# Patient Record
Sex: Female | Born: 1995 | Hispanic: No | Marital: Single | Smoking: Never smoker
Health system: Southern US, Community
[De-identification: ages and names within clinical notes are randomized; demographics above are authoritative.]

## PROBLEM LIST (undated history)

## (undated) DIAGNOSIS — N912 Amenorrhea, unspecified: Secondary | ICD-10-CM

## (undated) DIAGNOSIS — J45909 Unspecified asthma, uncomplicated: Secondary | ICD-10-CM

---
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
## 2014-04-14 ENCOUNTER — Other Ambulatory Visit: Payer: Self-pay | Admitting: Obstetrics & Gynecology

## 2014-04-14 DIAGNOSIS — Q51818 Other congenital malformations of uterus: Secondary | ICD-10-CM

## 2014-04-14 DIAGNOSIS — N912 Amenorrhea, unspecified: Secondary | ICD-10-CM

## 2014-04-20 ENCOUNTER — Ambulatory Visit
Admission: RE | Admit: 2014-04-20 | Discharge: 2014-04-20 | Disposition: A | Discharge status: Home / Self Care | Payer: BC Managed Care – PPO | Source: Ambulatory Visit | Attending: Obstetrics & Gynecology | Admitting: Obstetrics & Gynecology

## 2014-04-20 DIAGNOSIS — Q51818 Other congenital malformations of uterus: Secondary | ICD-10-CM

## 2014-04-20 DIAGNOSIS — N912 Amenorrhea, unspecified: Secondary | ICD-10-CM

## 2014-04-20 MED ORDER — GADOBENATE DIMEGLUMINE 529 MG/ML IV SOLN
18.0000 mL | Freq: Once | INTRAVENOUS | Status: AC | PRN
  Administered 2014-04-20: 18 mL via INTRAVENOUS

## 2015-06-01 ENCOUNTER — Emergency Department (HOSPITAL_COMMUNITY)
Admission: EM | Admit: 2015-06-01 | Discharge: 2015-06-01 | Disposition: A | Discharge status: Home / Self Care | Payer: BLUE CROSS/BLUE SHIELD | Attending: Emergency Medicine | Admitting: Emergency Medicine

## 2015-06-01 ENCOUNTER — Encounter (HOSPITAL_COMMUNITY): Payer: Self-pay | Admitting: Emergency Medicine

## 2015-06-01 ENCOUNTER — Emergency Department (HOSPITAL_COMMUNITY): Payer: BLUE CROSS/BLUE SHIELD

## 2015-06-01 DIAGNOSIS — Z3202 Encounter for pregnancy test, result negative: Secondary | ICD-10-CM | POA: Insufficient documentation

## 2015-06-01 DIAGNOSIS — R079 Chest pain, unspecified: Secondary | ICD-10-CM | POA: Diagnosis not present

## 2015-06-01 DIAGNOSIS — R110 Nausea: Secondary | ICD-10-CM | POA: Insufficient documentation

## 2015-06-01 DIAGNOSIS — J45909 Unspecified asthma, uncomplicated: Secondary | ICD-10-CM | POA: Insufficient documentation

## 2015-06-01 DIAGNOSIS — R1011 Right upper quadrant pain: Secondary | ICD-10-CM | POA: Diagnosis present

## 2015-06-01 HISTORY — DX: Amenorrhea, unspecified: N91.2

## 2015-06-01 HISTORY — DX: Unspecified asthma, uncomplicated: J45.909

## 2015-06-01 LAB — URINALYSIS, ROUTINE W REFLEX MICROSCOPIC
BILIRUBIN URINE: NEGATIVE
Glucose, UA: NEGATIVE mg/dL
Hgb urine dipstick: NEGATIVE
Ketones, ur: NEGATIVE mg/dL
LEUKOCYTES UA: NEGATIVE
NITRITE: NEGATIVE
Protein, ur: NEGATIVE mg/dL
Specific Gravity, Urine: 1.023 (ref 1.005–1.030)
UROBILINOGEN UA: 1 mg/dL (ref 0.0–1.0)
pH: 8.5 — ABNORMAL HIGH (ref 5.0–8.0)

## 2015-06-01 LAB — COMPREHENSIVE METABOLIC PANEL
ALK PHOS: 74 U/L (ref 38–126)
ALT: 29 U/L (ref 14–54)
AST: 25 U/L (ref 15–41)
Albumin: 4.2 g/dL (ref 3.5–5.0)
Anion gap: 8 (ref 5–15)
BILIRUBIN TOTAL: 0.5 mg/dL (ref 0.3–1.2)
BUN: 11 mg/dL (ref 6–20)
CO2: 26 mmol/L (ref 22–32)
Calcium: 9.3 mg/dL (ref 8.9–10.3)
Chloride: 105 mmol/L (ref 101–111)
Creatinine, Ser: 0.69 mg/dL (ref 0.44–1.00)
GFR calc Af Amer: >60 mL/min (ref >60)
GLUCOSE: 100 mg/dL — AB (ref 65–99)
Potassium: 3.6 mmol/L (ref 3.5–5.1)
SODIUM: 139 mmol/L (ref 135–145)
TOTAL PROTEIN: 7.2 g/dL (ref 6.5–8.1)

## 2015-06-01 LAB — CBC
HCT: 43 % (ref 36.0–46.0)
HEMOGLOBIN: 14.6 g/dL (ref 12.0–15.0)
MCH: 30.5 pg (ref 26.0–34.0)
MCHC: 34 g/dL (ref 30.0–36.0)
MCV: 89.8 fL (ref 78.0–100.0)
Platelets: 293 10*3/uL (ref 150–400)
RBC: 4.79 MIL/uL (ref 3.87–5.11)
RDW: 12.6 % (ref 11.5–15.5)
WBC: 11.5 10*3/uL — ABNORMAL HIGH (ref 4.0–10.5)

## 2015-06-01 LAB — LIPASE, BLOOD: Lipase: 37 U/L (ref 22–51)

## 2015-06-01 LAB — POC URINE PREG, ED: Preg Test, Ur: NEGATIVE

## 2015-06-01 MED ORDER — IBUPROFEN 800 MG PO TABS: 800.0000 mg | ORAL_TABLET | Freq: Three times a day (TID) | ORAL | Status: AC

## 2015-06-01 MED ORDER — IBUPROFEN 800 MG PO TABS
800.0000 mg | ORAL_TABLET | Freq: Once | ORAL | Status: AC
  Administered 2015-06-01: 800 mg via ORAL
  Filled 2015-06-01: qty 1

## 2015-06-01 NOTE — ED Notes (Signed)
Pt. reports RUQ pain onset last night with nausea , denies emesis or diarrhea . No fever or chills.

## 2015-06-01 NOTE — Discharge Instructions (Signed)

## 2015-06-01 NOTE — ED Provider Notes (Signed)
CSN: [id number]     Arrival date & time 06/01/15  0009 History  This chart was scribed for Eber Hong, MD by Doreatha Martin, ED Scribe. This patient was seen in room A07C/A07C and the patient's care was started at 2:34 AM.     Chief Complaint  Patient presents with  . Abdominal Pain   The history is provided by the patient. No language interpreter was used.    HPI Comments: Kelli Terry is a 19 y.o. female who presents to the Emergency Department complaining of moderate, sharp RUQ abdominal pain onset last night and worsened today. She states associated chills, nausea. Pt notes that pain is worsened by deep breathing, positional changes and relieved in the supine position. She states no new foods, no oral contraceptive use. Pt notes recent travel by plane to [place] one month ago. She denies vomiting, diarrhea, constipation, dysuria, cough, bloody stools, SOB, decreased appetite, current nausea.   Past Medical History  Diagnosis Date  . Asthma   . Amenorrhea    History reviewed. No pertinent past surgical history. No family history on file. History  Substance Use Topics  . Smoking status: Never Smoker   . Smokeless tobacco: Not on file  . Alcohol Use: No   OB History    No data available     Review of Systems  Constitutional: Positive for chills. Negative for appetite change.  Respiratory: Negative for cough and shortness of breath.   Gastrointestinal: Positive for nausea and abdominal pain. Negative for vomiting, diarrhea, constipation and blood in stool.  Genitourinary: Negative for dysuria.  All other systems reviewed and are negative.  Allergies  Review of patient's allergies indicates no known allergies.  Home Medications   Prior to Admission medications   Medication Sig Start Date End Date Taking? Authorizing Provider  ibuprofen (ADVIL,MOTRIN) 800 MG tablet Take 1 tablet (800 mg total) by mouth 3 (three) times daily. 06/01/15   Eber Hong, MD   BP 129/81 mmHg  Pulse 98   Temp(Src) 98.7 F (37.1 C) (Oral)  Resp 22  Ht 5\' 3"  (1.6 m)  Wt 196 lb (88.905 kg)  BMI 34.73 kg/m2  SpO2 99% Physical Exam  Constitutional: She appears well-developed and well-nourished. No distress.  HENT:  Head: Normocephalic and atraumatic.  Mouth/Throat: Oropharynx is clear and moist. No oropharyngeal exudate.  Eyes: Conjunctivae and EOM are normal. Pupils are equal, round, and reactive to light. Right eye exhibits no discharge. Left eye exhibits no discharge. No scleral icterus.  Neck: Normal range of motion. Neck supple. No JVD present. No thyromegaly present.  Cardiovascular: Normal rate, regular rhythm, normal heart sounds and intact distal pulses.  Exam reveals no gallop and no friction rub.   No murmur heard. Pulmonary/Chest: Effort normal and breath sounds normal. No respiratory distress. She has no wheezes. She has no rales.  Abdominal: Soft. Bowel sounds are normal. She exhibits no distension and no mass. There is tenderness.  TTP right anterior lateral lower ribs. No reproducible TTP to the RUQ.   Musculoskeletal: Normal range of motion. She exhibits no edema or tenderness.  Lymphadenopathy:    She has no cervical adenopathy.  Neurological: She is alert. Coordination normal.  Skin: Skin is warm and dry. No rash noted. No erythema.  Psychiatric: She has a normal mood and affect. Her behavior is normal.  Nursing note and vitals reviewed.  ED Course  Procedures (including critical care time) DIAGNOSTIC STUDIES: Oxygen Saturation is 99% on RA, normal by my interpretation.  COORDINATION OF CARE: 2:39 AM Discussed treatment plan with pt at bedside and pt agreed to plan.   Labs Review Labs Reviewed  COMPREHENSIVE METABOLIC PANEL - Abnormal; Notable for the following:    Glucose, Bld 100 (*)    All other components within normal limits  CBC - Abnormal; Notable for the following:    WBC 11.5 (*)    All other components within normal limits  URINALYSIS, ROUTINE W  REFLEX MICROSCOPIC (NOT AT [facility]) - Abnormal; Notable for the following:    APPearance CLOUDY (*)    pH 8.5 (*)    All other components within normal limits  LIPASE, BLOOD  POC URINE PREG, ED    Imaging Review Dg Chest 2 View  06/01/2015   CLINICAL DATA:  Lower chest pain for 1 day, shortness of breath. History of asthma.  EXAM: CHEST  2 VIEW  COMPARISON:  None.  FINDINGS: Cardiomediastinal silhouette is unremarkable. The lungs are clear without pleural effusions or focal consolidations. Trachea projects midline and there is no pneumothorax. Soft tissue planes and included osseous structures are non-suspicious. Mild lower thoracic levoscoliosis.  IMPRESSION: No acute cardiopulmonary process.   Electronically Signed   By: Awilda Metro M.D.   On: 06/01/2015 03:07      MDM   Final diagnoses:  Chest pain    The patient's abdominal discomfort is nonspecific, seems to be reproducible over the chest wall but no right upper quadrant tenderness. Labs are totally unremarkable. Chest x-ray shows no acute processes, I have personally viewed and interpreted these results and I agree with the radiologist interpretation. She has been given ibuprofen, she appears stable for discharge.  Meds given in ED:  Medications  ibuprofen (ADVIL,MOTRIN) tablet 800 mg (not administered)    New Prescriptions   IBUPROFEN (ADVIL,MOTRIN) 800 MG TABLET    Take 1 tablet (800 mg total) by mouth 3 (three) times daily.    I have personally viewed and interpreted the imaging and agree with radiologist interpretation.  I personally performed the services described in this documentation, which was scribed in my presence. The recorded information has been reviewed and is accurate.       Eber Hong, MD 06/01/15 [phone]

## 2016-12-16 IMAGING — CR DG CHEST 2V
2 series · 2 of 2 positions shown · non-contrast
Comparison: None.

CLINICAL DATA: Lower chest pain for 1 day, shortness of breath.
History of asthma.

EXAM:
CHEST  2 VIEW

[chest pa]
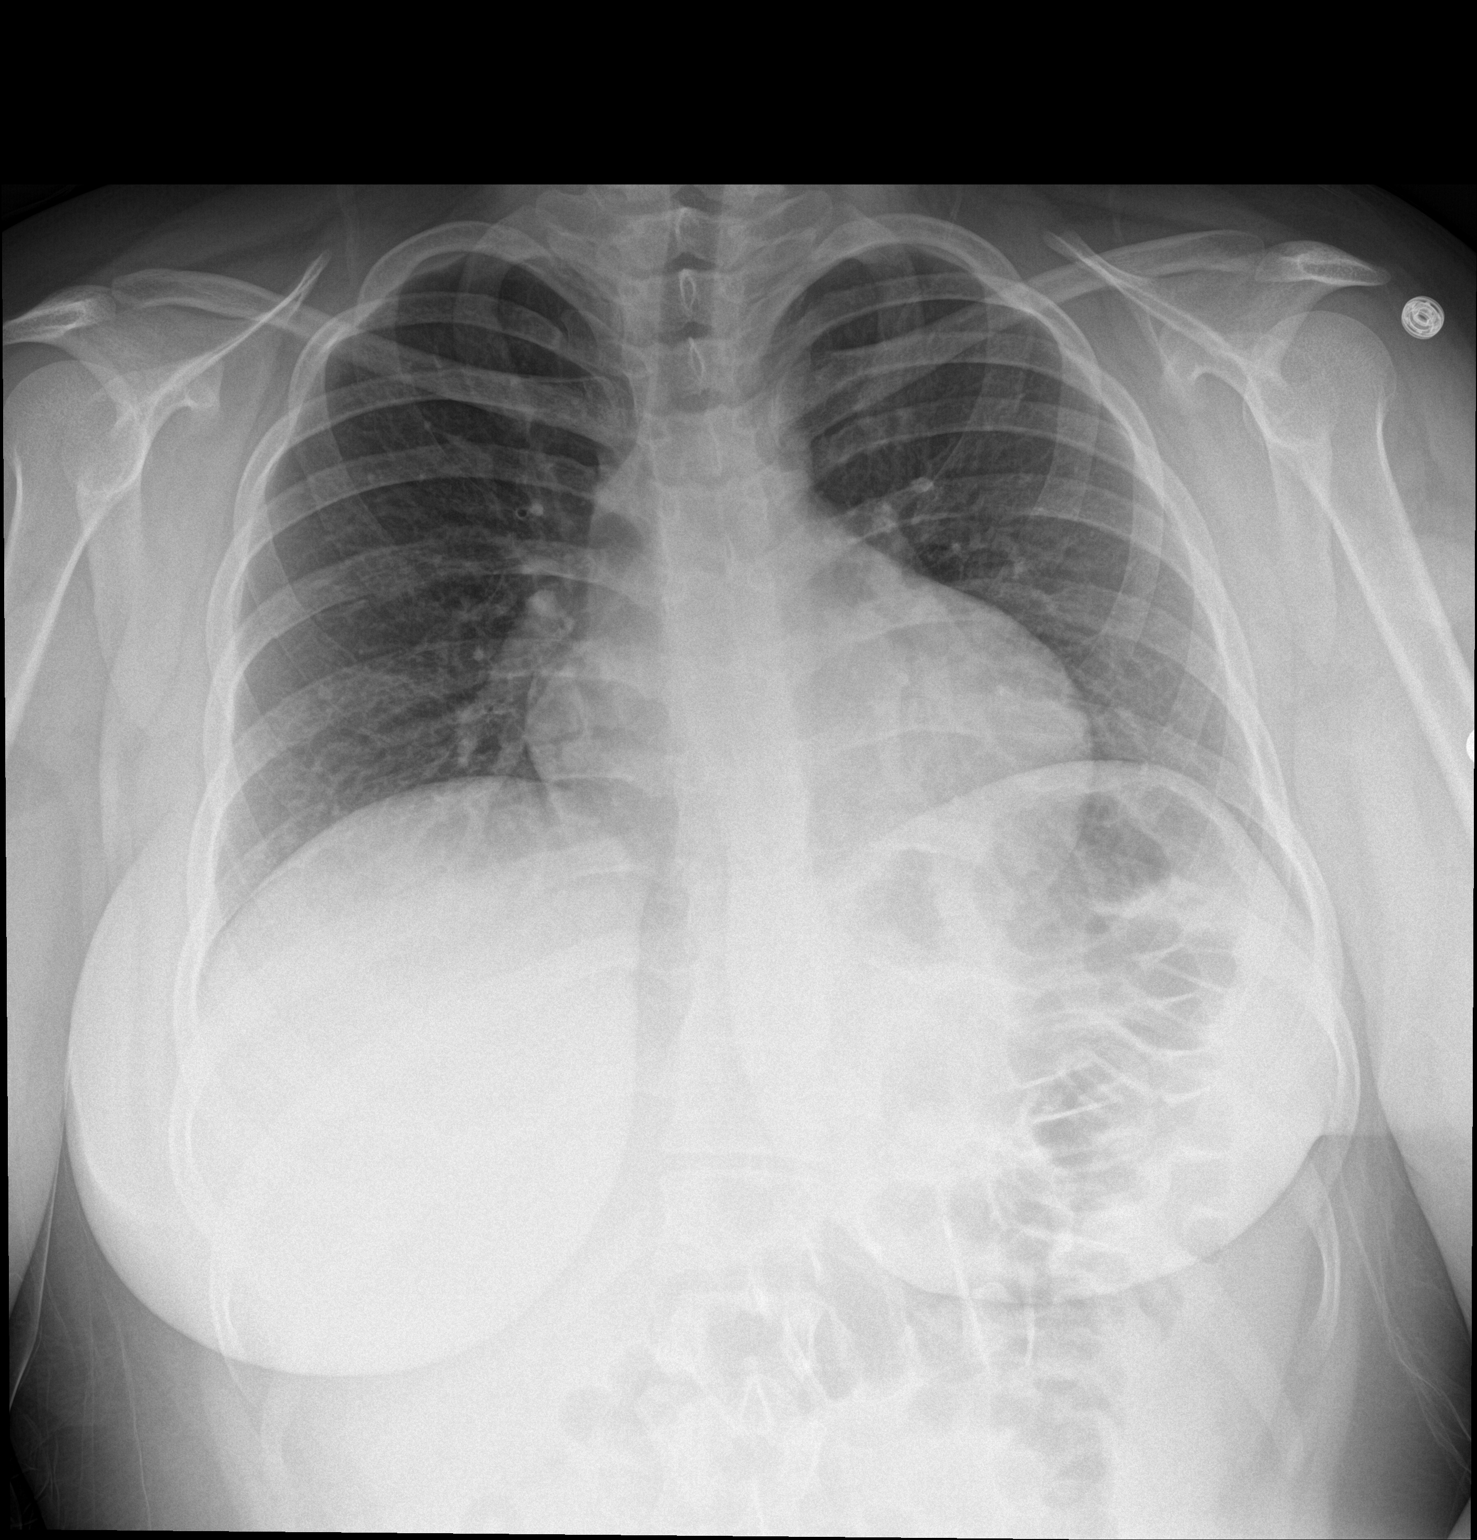

[chest lat]
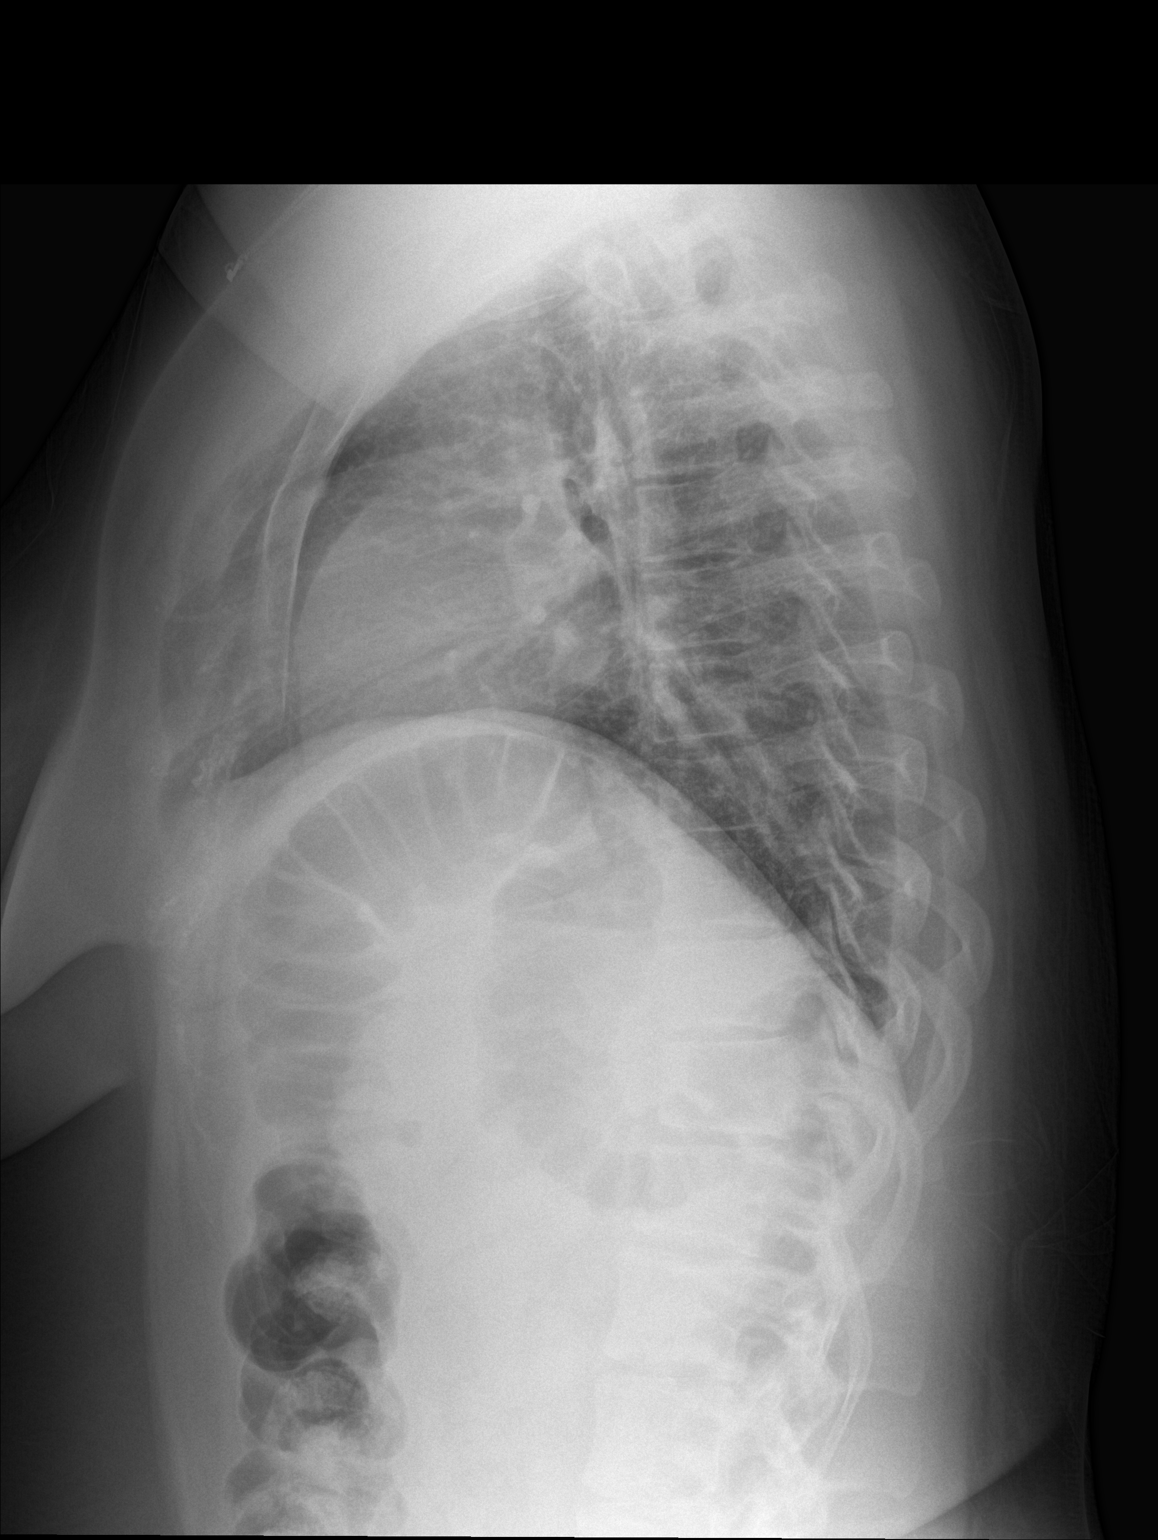

[2 of 2 positions shown; findings below may reference images not displayed]

FINDINGS: Cardiomediastinal silhouette is unremarkable. The lungs are clear
without pleural effusions or focal consolidations. Trachea projects
midline and there is no pneumothorax. Soft tissue planes and
included osseous structures are non-suspicious. Mild lower thoracic
levoscoliosis.
IMPRESSION: No acute cardiopulmonary process.

## 2023-12-11 DIAGNOSIS — Q528 Other specified congenital malformations of female genitalia: Secondary | ICD-10-CM | POA: Diagnosis not present

## 2023-12-11 DIAGNOSIS — Q7649 Other congenital malformations of spine, not associated with scoliosis: Secondary | ICD-10-CM | POA: Diagnosis not present

## 2024-06-13 ENCOUNTER — Emergency Department (HOSPITAL_BASED_OUTPATIENT_CLINIC_OR_DEPARTMENT_OTHER)

## 2024-06-13 ENCOUNTER — Emergency Department (HOSPITAL_BASED_OUTPATIENT_CLINIC_OR_DEPARTMENT_OTHER)
Admission: EM | Admit: 2024-06-13 | Discharge: 2024-06-13 | Disposition: A | Discharge status: Home / Self Care | Attending: Emergency Medicine | Admitting: Emergency Medicine

## 2024-06-13 ENCOUNTER — Other Ambulatory Visit: Payer: Self-pay

## 2024-06-13 ENCOUNTER — Encounter (HOSPITAL_BASED_OUTPATIENT_CLINIC_OR_DEPARTMENT_OTHER): Payer: Self-pay | Admitting: Emergency Medicine

## 2024-06-13 DIAGNOSIS — Q600 Renal agenesis, unilateral: Secondary | ICD-10-CM | POA: Diagnosis not present

## 2024-06-13 DIAGNOSIS — N201 Calculus of ureter: Secondary | ICD-10-CM

## 2024-06-13 DIAGNOSIS — R1031 Right lower quadrant pain: Secondary | ICD-10-CM | POA: Diagnosis present

## 2024-06-13 DIAGNOSIS — N134 Hydroureter: Secondary | ICD-10-CM | POA: Diagnosis not present

## 2024-06-13 LAB — COMPREHENSIVE METABOLIC PANEL WITH GFR
ALT: 11 U/L (ref 0–44)
AST: 20 U/L (ref 15–41)
Albumin: 4.3 g/dL (ref 3.5–5.0)
Alkaline Phosphatase: 61 U/L (ref 38–126)
Anion gap: 11 (ref 5–15)
BUN: 18 mg/dL (ref 6–20)
CO2: 22 mmol/L (ref 22–32)
Calcium: 9.7 mg/dL (ref 8.9–10.3)
Chloride: 105 mmol/L (ref 98–111)
Creatinine, Ser: 0.68 mg/dL (ref 0.44–1.00)
GFR, Estimated: >60 mL/min (ref >60)
Glucose, Bld: 90 mg/dL (ref 70–99)
Potassium: 4.6 mmol/L (ref 3.5–5.1)
Sodium: 138 mmol/L (ref 135–145)
Total Bilirubin: 0.5 mg/dL (ref 0.0–1.2)
Total Protein: 6.7 g/dL (ref 6.5–8.1)

## 2024-06-13 LAB — URINALYSIS, ROUTINE W REFLEX MICROSCOPIC
Bacteria, UA: NONE SEEN
Bilirubin Urine: NEGATIVE
Glucose, UA: NEGATIVE mg/dL
Ketones, ur: NEGATIVE mg/dL
Leukocytes,Ua: NEGATIVE
Nitrite: NEGATIVE
Protein, ur: NEGATIVE mg/dL
RBC / HPF: >50 RBC/hpf (ref 0–5)
Specific Gravity, Urine: 1.021 (ref 1.005–1.030)
pH: 6.5 (ref 5.0–8.0)

## 2024-06-13 LAB — CBC WITH DIFFERENTIAL/PLATELET
Abs Immature Granulocytes: 0.04 K/uL (ref 0.00–0.07)
Basophils Absolute: 0 K/uL (ref 0.0–0.1)
Basophils Relative: 0 %
Eosinophils Absolute: 0 K/uL (ref 0.0–0.5)
Eosinophils Relative: 0 %
HCT: 42.5 % (ref 36.0–46.0)
Hemoglobin: 14.6 g/dL (ref 12.0–15.0)
Immature Granulocytes: 0 %
Lymphocytes Relative: 26 %
Lymphs Abs: 2.5 K/uL (ref 0.7–4.0)
MCH: 31.5 pg (ref 26.0–34.0)
MCHC: 34.4 g/dL (ref 30.0–36.0)
MCV: 91.8 fL (ref 80.0–100.0)
Monocytes Absolute: 0.5 K/uL (ref 0.1–1.0)
Monocytes Relative: 5 %
Neutro Abs: 6.5 K/uL (ref 1.7–7.7)
Neutrophils Relative %: 69 %
Platelets: 261 K/uL (ref 150–400)
RBC: 4.63 MIL/uL (ref 3.87–5.11)
RDW: 12.8 % (ref 11.5–15.5)
WBC: 9.6 K/uL (ref 4.0–10.5)
nRBC: 0 % (ref 0.0–0.2)

## 2024-06-13 LAB — PREGNANCY, URINE: Preg Test, Ur: NEGATIVE

## 2024-06-13 LAB — LIPASE, BLOOD: Lipase: 66 U/L — ABNORMAL HIGH (ref 11–51)

## 2024-06-13 MED ORDER — OXYCODONE HCL 5 MG PO TABS: 5.0000 mg | ORAL_TABLET | Freq: Four times a day (QID) | ORAL | 0 refills | Status: AC | PRN

## 2024-06-13 MED ORDER — ONDANSETRON 4 MG PO TBDP: 4.0000 mg | ORAL_TABLET | Freq: Three times a day (TID) | ORAL | 0 refills | Status: AC | PRN

## 2024-06-13 NOTE — ED Provider Notes (Signed)
 [place] EMERGENCY DEPARTMENT AT [facility] Provider Note   CSN: [id number] Arrival date & time: 06/13/24  9157     Patient presents with: Abdominal Pain   Kelli Terry is a 28 y.o. female.   Patient with no past surgical history, history of solitary kidney -- presents to the emergency department today for evaluation of right lower quadrant abdominal pain.  Symptoms started yesterday, gradually worsened.  While she was at work last night, pain was more intense, caused an episode of vomiting.  She has just had nausea today.  She has had diarrhea without blood.  No constipation.  She noted blood in the urine last week for a few days, resolved.  She reports history of abdominal pains related to a traumatic event.  No fevers, chest pain, shortness of breath.  No treatments prior to arrival.       Prior to Admission medications   Medication Sig Start Date End Date Taking? Authorizing Provider  ibuprofen  (ADVIL ,MOTRIN ) 800 MG tablet Take 1 tablet (800 mg total) by mouth 3 (three) times daily. 06/01/15   Cleotilde Rogue, MD    Allergies: Patient has no known allergies.    Review of Systems  Updated Vital Signs BP 120/74 (BP Location: Right Arm)   Pulse 72   Temp 98.1 F (36.7 C) (Oral)   Resp 14   SpO2 100%   Physical Exam Vitals and nursing note reviewed.  Constitutional:      General: She is not in acute distress.    Appearance: She is well-developed.  HENT:     Head: Normocephalic and atraumatic.     Right Ear: External ear normal.     Left Ear: External ear normal.     Nose: Nose normal.  Eyes:     Conjunctiva/sclera: Conjunctivae normal.  Cardiovascular:     Rate and Rhythm: Normal rate and regular rhythm.     Heart sounds: No murmur heard. Pulmonary:     Effort: No respiratory distress.     Breath sounds: No wheezing, rhonchi or rales.  Abdominal:     Palpations: Abdomen is soft.     Tenderness: There is abdominal tenderness in the right upper  quadrant and right lower quadrant. There is no guarding or rebound. Negative signs include Murphy's sign and McBurney's sign.  Musculoskeletal:     Cervical back: Normal range of motion and neck supple.     Right lower leg: No edema.     Left lower leg: No edema.  Skin:    General: Skin is warm and dry.     Findings: No rash.  Neurological:     General: No focal deficit present.     Mental Status: She is alert. Mental status is at baseline.     Motor: No weakness.  Psychiatric:        Mood and Affect: Mood normal.     (all labs ordered are listed, but only abnormal results are displayed) Labs Reviewed  LIPASE, BLOOD - Abnormal; Notable for the following components:      Result Value   Lipase 66 (*)    All other components within normal limits  URINALYSIS, ROUTINE W REFLEX MICROSCOPIC - Abnormal; Notable for the following components:   Hgb urine dipstick MODERATE (*)    All other components within normal limits  CBC WITH DIFFERENTIAL/PLATELET  COMPREHENSIVE METABOLIC PANEL WITH GFR  PREGNANCY, URINE    EKG: None  Radiology: CT Renal Stone Study Result Date: 06/13/2024 CLINICAL DATA:  Right  flank pain and right lower quadrant abdominal pain. Hematuria. Nausea and vomiting. 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 EXAM: CT ABDOMEN AND PELVIS WITHOUT CONTRAST TECHNIQUE: Multidetector CT imaging of the abdomen and pelvis was performed following the standard protocol without IV contrast. RADIATION DOSE REDUCTION: This exam was performed according to the departmental dose-optimization program which includes automated exposure control, adjustment of the mA and/or kV according to patient size and/or use of iterative reconstruction technique. COMPARISON:  Pelvic MRI 04/20/2014 FINDINGS: Lower chest: Mild pectus excavatum, Haller index 3.1. Hepatobiliary: Unremarkable Pancreas: Unremarkable Spleen: Unremarkable Adrenals/Urinary Tract: Both adrenal glands appear  unremarkable. Absent left kidney with mild compensatory hypertrophy of the right kidney Borderline prominence the right collecting system and borderline right mid hydroureter noted along with a 0.2 cm right distal ureteral calculus shown on image 71 series 2. There is also a punctate 1-2 mm right kidney upper pole nonobstructive renal calculus. Stomach/Bowel: Unremarkable.  Normal appendix. Vascular/Lymphatic: Unremarkable Reproductive: Absent uterus.  Adnexa unremarkable. Other: No supplemental non-categorized findings. Musculoskeletal: Mild and likely physiologic anterior wedging at T12. IMPRESSION: 1. 2 mm right distal ureteral calculus with borderline prominence of the right collecting system and borderline right mid hydroureter. 2. Punctate 1-2 mm right kidney upper pole nonobstructive renal calculus. 3. Absent left kidney with mild compensatory hypertrophy of the right kidney. 4. Mild pectus excavatum, Haller index 3.1. Electronically Signed   By: Ryan Salvage M.D.   On: 06/13/2024 13:13     Procedures   Medications Ordered in the ED - No data to display  ED Course  Patient seen and examined. History obtained directly from patient.   Labs/EKG: Ordered CBC, CMP, lipase, UA  Imaging: None ordered, will consider imaging of the abdomen pelvis  Medications/Fluids: None ordered  Most recent vital signs reviewed and are as follows: BP 120/74 (BP Location: Right Arm)   Pulse 72   Temp 98.1 F (36.7 C) (Oral)   Resp 14   SpO2 100%   Initial impression: Right sided abdominal pain  2:42 PM Reassessment performed. Patient appears improved on several rechecks.  Her pain is controlled without any medications.  Labs personally reviewed and interpreted including: CBC unremarkable; CMP unremarkable; lipase slightly elevated at 66; UA with blood, no compelling signs of infection.  Imaging personally visualized and interpreted including: CT renal protocol was ordered, this showed a 2 mm  distal stone without other causes of pain, history of congenital solitary kidney.  Reviewed pertinent lab work and imaging with patient at bedside. Questions answered.   Most current vital signs reviewed and are as follows: BP 110/72   Pulse 65   Temp 98.1 F (36.7 C)   Resp 16   SpO2 100%   I discussed case with on-call urology APP who I discussed with attending.  They reviewed imaging.  Patient has minimal signs of urinary obstruction.  She is producing urine.  Her pain is controlled.  The kidney stone is small and extremely distal.  At this point, do not feel that the risk of this small stone outweighs the risk of the urologic procedure.  Patient can monitor symptoms very carefully and seems reliable.  Urology recommendations: Strain urine, save any debris, outpatient follow-up.  Return with no urination, uncontrolled symptoms.  Plan: Discharge to home.   Prescriptions written for: Oxycodone  # 6 tablets, Zofran   Other home care instructions discussed: Good hydration  2:47 PM Patient counseled on kidney stone treatment. Urged patient to strain urine and save  any stones. Urged urology follow-up and return to [facility] with any complications. Counseled patient to maintain good fluid intake.   Patient counseled on use of narcotic pain medications. Counseled not to combine these medications with others containing tylenol. Urged not to drink alcohol, drive, or perform any other activities that requires focus while taking these medications. The patient verbalizes understanding and agrees with the plan.                                   Medical Decision Making Amount and/or Complexity of Data Reviewed Labs: ordered. Radiology: ordered.  Risk Prescription drug management.   For this patient's complaint of abdominal pain, the following conditions were considered on the differential diagnosis: gastritis/PUD, enteritis/duodenitis, appendicitis, cholelithiasis/cholecystitis, cholangitis,  pancreatitis, ruptured viscus, colitis, diverticulitis, small/large bowel obstruction, proctitis, cystitis, pyelonephritis, ureteral colic, aortic dissection, aortic aneurysm. In women, ectopic pregnancy, pelvic inflammatory disease, ovarian cysts, and tubo-ovarian abscess were also considered. Atypical chest etiologies were also considered including ACS, PE, and pneumonia.  Patient with kidney stone, congenital solitary kidney.  Minimal signs of obstruction on CT.  Patient is making urine.  Normal kidney function.  Discussed with urology, we are all comfortable with patient monitoring symptoms.  Patient is in agreement.  She seems reliable to return with worsening.  Encouraged outpatient follow-up.  The patient's vital signs, pertinent lab work and imaging were reviewed and interpreted as discussed in the ED course. Hospitalization was considered for further testing, treatments, or serial exams/observation. However as patient is well-appearing, has a stable exam, and reassuring studies today, I do not feel that they warrant admission at this time. This plan was discussed with the patient who verbalizes agreement and comfort with this plan and seems reliable and able to return to the Emergency Department with worsening or changing symptoms.         Final diagnoses:  Right ureteral stone  Solitary kidney, congenital    ED Discharge Orders          Ordered    oxyCODONE  (OXY IR/ROXICODONE ) 5 MG immediate release tablet  Every 6 hours PRN        06/13/24 1441    ondansetron  (ZOFRAN -ODT) 4 MG disintegrating tablet  Every 8 hours PRN        06/13/24 1441               Ladislao Cohenour, PA-C 06/13/24 1448    Tegeler, Lonni PARAS, MD 06/13/24 1526

## 2024-06-13 NOTE — Discharge Instructions (Signed)
 Please read and follow all provided instructions.  Your diagnoses today include:  1. Right ureteral stone   2. Solitary kidney, congenital     Tests performed today include: Urine test that showed blood in your urine and no infection CT scan which showed a 2 millimeter kidney stone on the left side Blood test that showed normal kidney function Vital signs. See below for your results today.   Medications prescribed:  Oxycodone  - narcotic pain medication  DO NOT drive or perform any activities that require you to be awake and alert because this medicine can make you drowsy.   Zofran  (ondansetron ) - for nausea and vomiting  Take any prescribed medications only as directed.  Home care instructions:  Follow any educational materials contained in this packet.  Please double your fluid intake for the next several days. Strain your urine and save any stones that may pass.   BE VERY CAREFUL not to take multiple medicines containing Tylenol (also called acetaminophen). Doing so can lead to an overdose which can damage your liver and cause liver failure and possibly death.   Follow-up instructions: Please follow-up with your urologist or the urologist referral (provided on front page) in the next 1 week for further evaluation of your symptoms.  Return instructions:  If you need to return to the Emergency Department, go to [facility]. The urologists are located at [facility] and can better care for you at this location.  Please return to the Emergency Department if you experience worsening symptoms.  Please return if you develop fever or uncontrolled pain or vomiting. Please return if you have any other emergent concerns.  Additional Information:  Your vital signs today were: BP 110/72   Pulse 65   Temp 98.1 F (36.7 C)   Resp 16   SpO2 100%  If your blood pressure (BP) was elevated above 135/85 this visit, please have this repeated by your doctor within one  month. --------------

## 2024-06-13 NOTE — ED Notes (Signed)
 Pt states last Mon-Thur she had a small amount of blood in her urine. She says that she has a medical condition where she does not get her period.

## 2024-06-13 NOTE — ED Triage Notes (Signed)
 C/o RLQ pain since last night. +n/v. Denies fevers.
